# Patient Record
Sex: Female | Born: 2000 | Race: Black or African American | Hispanic: No | Marital: Single | Smoking: Never smoker
Health system: Southern US, Community
[De-identification: ages and names within clinical notes are randomized; demographics above are authoritative.]

## PROBLEM LIST (undated history)

## (undated) DIAGNOSIS — R109 Unspecified abdominal pain: Secondary | ICD-10-CM

## (undated) HISTORY — DX: Unspecified abdominal pain: R10.9

---
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
## 2020-02-15 ENCOUNTER — Encounter (HOSPITAL_COMMUNITY): Payer: Self-pay

## 2020-02-15 ENCOUNTER — Other Ambulatory Visit: Payer: Self-pay

## 2020-02-15 ENCOUNTER — Ambulatory Visit (HOSPITAL_COMMUNITY)
Admission: EM | Admit: 2020-02-15 | Discharge: 2020-02-15 | Disposition: A | Discharge status: Home / Self Care | Payer: Medicaid Other | Attending: Family Medicine | Admitting: Family Medicine

## 2020-02-15 DIAGNOSIS — R21 Rash and other nonspecific skin eruption: Secondary | ICD-10-CM | POA: Diagnosis not present

## 2020-02-15 DIAGNOSIS — R350 Frequency of micturition: Secondary | ICD-10-CM | POA: Diagnosis not present

## 2020-02-15 LAB — POC URINE PREG, ED: Preg Test, Ur: NEGATIVE

## 2020-02-15 LAB — POCT URINALYSIS DIPSTICK, ED / UC
Bilirubin Urine: NEGATIVE
Glucose, UA: NEGATIVE mg/dL
Ketones, ur: NEGATIVE mg/dL
Leukocytes,Ua: NEGATIVE
Nitrite: NEGATIVE
Protein, ur: NEGATIVE mg/dL
Specific Gravity, Urine: 1.02 (ref 1.005–1.030)
Urobilinogen, UA: 0.2 mg/dL (ref 0.0–1.0)
pH: 7.5 (ref 5.0–8.0)

## 2020-02-15 MED ORDER — TRIAMCINOLONE ACETONIDE 0.1 % EX CREA: 1.0000 "application " | TOPICAL_CREAM | Freq: Two times a day (BID) | CUTANEOUS | 0 refills | Status: DC

## 2020-02-15 NOTE — Discharge Instructions (Addendum)
Apply the cream twice a day to the area  Urine did not show any infection Decrease use of bladder irritants including coffee, teas and sodas. Follow up as needed for continued or worsening symptoms

## 2020-02-15 NOTE — ED Triage Notes (Signed)
Pt in with c/o rash to left side of neck that she noticed 2 days ago. States that it is itching and has turned from red to dark color.  Pt took ibuprofen for pain with no relief  Also states that she has been having urinary urgency Denies hematuria, abdominal pain, vaginal irritation,

## 2020-02-15 NOTE — ED Provider Notes (Signed)
[facility]    CSN: [id number] Arrival date & time: 02/15/20  1400      History   Chief Complaint Chief Complaint  Patient presents with  . Rash    HPI Jenna Tanner is a 19 y.o. female.   Patient is a 19 year old female presents today with multiple complaints.  First being rash to left side of neck that she noticed 2 days ago.  States mild itching and tenderness.  Went  from red to dark color.  Noted that her seatbelt rubs that area.  No new jewelry recently.  Denies any fever, joint pain. Denies any recent changes in lotions, detergents, foods or other possible irritants. No recent travel. Nobody else at home has the rash. Patient has been outside but denies any contact with plants or insects. No new foods or medications.  She has also been having urinary frequency. No hematuria, dysuria, abdominal or pelvic pain. No vaginal discharge. Patient's last menstrual period was 02/12/2020 (approximate).        Past Medical History:  Diagnosis Date  . Abdominal pain     Patient Active Problem List   Diagnosis Date Noted  . Nausea 10/18/2011  . Diarrhea 10/18/2011  . Generalized abdominal cramping     History reviewed. No pertinent surgical history.  OB History   No obstetric history on file.      Home Medications    Prior to Admission medications   Medication Sig Start Date End Date Taking? Authorizing Provider  cetirizine (ZYRTEC) 10 MG tablet Take 10 mg by mouth daily.    [provider]  ibuprofen (ADVIL,MOTRIN) 400 MG tablet Take 400 mg by mouth every 6 (six) hours.    [provider]  medroxyPROGESTERone (PROVERA) 10 MG tablet Take 10 mg by mouth daily.    [provider]  triamcinolone (KENALOG) 0.1 % Apply 1 application topically 2 (two) times daily. 02/15/20   Janace Aris, NP    Family History Family History  Problem Relation Age of Onset  . Lactose intolerance Maternal Grandmother   . Lactose intolerance Cousin    . Inflammatory bowel disease Neg Hx     Social History Social History   Tobacco Use  . Smoking status: Never Smoker  . Smokeless tobacco: Never Used  Vaping Use  . Vaping Use: Never used  Substance Use Topics  . Alcohol use: Never  . Drug use: Yes    Types: Marijuana     Allergies   Patient has no known allergies.   Review of Systems Review of Systems   Physical Exam Triage Vital Signs ED Triage Vitals  Enc Vitals Group     BP 02/15/20 1522 113/81     Pulse Rate 02/15/20 1522 78     Resp 02/15/20 1522 17     Temp 02/15/20 1522 99 F (37.2 C)     Temp Source 02/15/20 1522 Oral     SpO2 02/15/20 1522 100 %     Weight --      Height --      Head Circumference --      Peak Flow --      Pain Score 02/15/20 1518 7     Pain Loc --      Pain Edu? --      Excl. in GC? --    No data found.  Updated Vital Signs BP 113/81 (BP Location: Left Arm)   Pulse 78   Temp 99 F (37.2 C) (Oral)  Resp 17   LMP 02/12/2020 (Approximate)   SpO2 100%   Visual Acuity Right Eye Distance:   Left Eye Distance:   Bilateral Distance:    Right Eye Near:   Left Eye Near:    Bilateral Near:     Physical Exam Vitals and nursing note reviewed.  Constitutional:      General: She is not in acute distress.    Appearance: Normal appearance. She is not ill-appearing, toxic-appearing or diaphoretic.  HENT:     Head: Normocephalic.     Nose: Nose normal.  Eyes:     Conjunctiva/sclera: Conjunctivae normal.  Neck:      Comments: Erythema with hyperpigmented dry skin.  Pulmonary:     Effort: Pulmonary effort is normal.  Musculoskeletal:        General: Normal range of motion.     Cervical back: Normal range of motion.  Skin:    General: Skin is warm and dry.     Findings: Rash present.  Neurological:     Mental Status: She is alert.  Psychiatric:        Mood and Affect: Mood normal.      UC Treatments / Results  Labs (all labs ordered are listed, but only abnormal  results are displayed) Labs Reviewed - No data to display  EKG   Radiology No results found.  Procedures Procedures (including critical care time)  Medications Ordered in UC Medications - No data to display  Initial Impression / Assessment and Plan / UC Course  I have reviewed the triage vital signs and the nursing notes.  Pertinent labs & imaging results that were available during my care of the patient were reviewed by me and considered in my medical decision making (see chart for details).     Rash- most likely inflammatory from contact irritant  Will treat with steroid cream and have her monitor.   Urinary frequency Urine with small hgb  No concern for UTI at this time Pregnancy test negative.   Follow up as needed for continued or worsening symptoms   Final Clinical Impressions(s) / UC Diagnoses   Final diagnoses:  Rash  Urinary frequency     Discharge Instructions     Apply the cream twice a day.     ED Prescriptions    Medication Sig Dispense Auth. Provider   triamcinolone (KENALOG) 0.1 % Apply 1 application topically 2 (two) times daily. 30 g Dahlia Byes A, NP     PDMP not reviewed this encounter.   Janace Aris, NP 02/15/20 1621

## 2020-10-10 ENCOUNTER — Emergency Department (HOSPITAL_COMMUNITY): Payer: Medicaid Other

## 2020-10-10 ENCOUNTER — Other Ambulatory Visit: Payer: Self-pay

## 2020-10-10 ENCOUNTER — Emergency Department (HOSPITAL_COMMUNITY)
Admission: EM | Admit: 2020-10-10 | Discharge: 2020-10-10 | Disposition: A | Discharge status: Home / Self Care | Payer: Medicaid Other | Attending: Student | Admitting: Student

## 2020-10-10 DIAGNOSIS — R1084 Generalized abdominal pain: Secondary | ICD-10-CM | POA: Insufficient documentation

## 2020-10-10 DIAGNOSIS — R197 Diarrhea, unspecified: Secondary | ICD-10-CM | POA: Insufficient documentation

## 2020-10-10 DIAGNOSIS — N898 Other specified noninflammatory disorders of vagina: Secondary | ICD-10-CM | POA: Diagnosis not present

## 2020-10-10 DIAGNOSIS — W01198A Fall on same level from slipping, tripping and stumbling with subsequent striking against other object, initial encounter: Secondary | ICD-10-CM | POA: Insufficient documentation

## 2020-10-10 DIAGNOSIS — Z5321 Procedure and treatment not carried out due to patient leaving prior to being seen by health care provider: Secondary | ICD-10-CM | POA: Insufficient documentation

## 2020-10-10 DIAGNOSIS — M79661 Pain in right lower leg: Secondary | ICD-10-CM | POA: Insufficient documentation

## 2020-10-10 DIAGNOSIS — R112 Nausea with vomiting, unspecified: Secondary | ICD-10-CM | POA: Insufficient documentation

## 2020-10-10 LAB — URINALYSIS, ROUTINE W REFLEX MICROSCOPIC
Bilirubin Urine: NEGATIVE
Glucose, UA: NEGATIVE mg/dL
Hgb urine dipstick: NEGATIVE
Ketones, ur: 20 mg/dL — AB
Leukocytes,Ua: NEGATIVE
Nitrite: NEGATIVE
Protein, ur: NEGATIVE mg/dL
Specific Gravity, Urine: 1.02 (ref 1.005–1.030)
pH: 7 (ref 5.0–8.0)

## 2020-10-10 LAB — COMPREHENSIVE METABOLIC PANEL
ALT: 17 U/L (ref 0–44)
AST: 53 U/L — ABNORMAL HIGH (ref 15–41)
Albumin: 3.7 g/dL (ref 3.5–5.0)
Alkaline Phosphatase: 51 U/L (ref 38–126)
Anion gap: 6 (ref 5–15)
BUN: 8 mg/dL (ref 6–20)
CO2: 23 mmol/L (ref 22–32)
Calcium: 8.9 mg/dL (ref 8.9–10.3)
Chloride: 108 mmol/L (ref 98–111)
Creatinine, Ser: 1.01 mg/dL — ABNORMAL HIGH (ref 0.44–1.00)
GFR, Estimated: >60 mL/min (ref >60)
Glucose, Bld: 103 mg/dL — ABNORMAL HIGH (ref 70–99)
Potassium: 3.8 mmol/L (ref 3.5–5.1)
Sodium: 137 mmol/L (ref 135–145)
Total Bilirubin: 0.6 mg/dL (ref 0.3–1.2)
Total Protein: 6.7 g/dL (ref 6.5–8.1)

## 2020-10-10 LAB — CBC WITH DIFFERENTIAL/PLATELET
Abs Immature Granulocytes: 0.02 10*3/uL (ref 0.00–0.07)
Basophils Absolute: 0 10*3/uL (ref 0.0–0.1)
Basophils Relative: 0 %
Eosinophils Absolute: 0 10*3/uL (ref 0.0–0.5)
Eosinophils Relative: 1 %
HCT: 32.3 % — ABNORMAL LOW (ref 36.0–46.0)
Hemoglobin: 9.4 g/dL — ABNORMAL LOW (ref 12.0–15.0)
Immature Granulocytes: 0 %
Lymphocytes Relative: 34 %
Lymphs Abs: 2.4 10*3/uL (ref 0.7–4.0)
MCH: 22.2 pg — ABNORMAL LOW (ref 26.0–34.0)
MCHC: 29.1 g/dL — ABNORMAL LOW (ref 30.0–36.0)
MCV: 76.2 fL — ABNORMAL LOW (ref 80.0–100.0)
Monocytes Absolute: 0.5 10*3/uL (ref 0.1–1.0)
Monocytes Relative: 8 %
Neutro Abs: 4 10*3/uL (ref 1.7–7.7)
Neutrophils Relative %: 57 %
Platelets: 331 10*3/uL (ref 150–400)
RBC: 4.24 MIL/uL (ref 3.87–5.11)
RDW: 18.6 % — ABNORMAL HIGH (ref 11.5–15.5)
WBC: 6.9 10*3/uL (ref 4.0–10.5)
nRBC: 0 % (ref 0.0–0.2)

## 2020-10-10 LAB — I-STAT BETA HCG BLOOD, ED (MC, WL, AP ONLY): I-stat hCG, quantitative: <5 m[IU]/mL (ref <5)

## 2020-10-10 LAB — LIPASE, BLOOD: Lipase: 33 U/L (ref 11–51)

## 2020-10-10 NOTE — ED Provider Notes (Signed)
Emergency Medicine Provider Triage Evaluation Note  Jenna Tanner , a 20 y.o. female  was evaluated in triage.  Pt complains of RLE pain after a mechanical fall yesterday. Patient states she tripped and slammed her leg into a door. Pain associated with ecchymosis. No head injury or LOC  She also endorses generalized abdominal pain x3 days associated with nausea, vomiting, and diarrhea. She admits to vaginal bleeding today. Endorses increased vaginal discharge.   Review of Systems  Positive: Arthralgia, abdominal pain, N/V/D Negative: fever  Physical Exam  BP 121/66 (BP Location: Left Arm)   Pulse 78   Temp 98.8 F (37.1 C) (Oral)   Resp 16   Ht 5' (1.524 m)   Wt 59 kg   SpO2 100%   BMI 25.39 kg/m  Gen:   Awake, no distress   Resp:  Normal effort  MSK:   Moves extremities without difficulty  Other:  Abdomen soft, nondistended, nontender to palpation in all quadrants without guarding or peritoneal signs. No rebound.    Medical Decision Making  Medically screening exam initiated at 4:07 PM.  Appropriate orders placed.  Jenna Tanner was informed that the remainder of the evaluation will be completed by another provider, this initial triage assessment does not replace that evaluation, and the importance of remaining in the ED until their evaluation is complete.  Abdominal labs X-rays to rule out bony fractures   Mannie Stabile, PA-C 10/10/20 1610    Arby Barrette, MD 10/20/20 [phone]

## 2020-10-10 NOTE — ED Notes (Signed)
Patient transported to X-ray 

## 2020-10-10 NOTE — ED Notes (Signed)
Pt stated she was leaving with her mom.

## 2020-10-10 NOTE — ED Triage Notes (Signed)
C/o right lower leg pain. Stated trip and fell yesterday and ankle hurts when walking .

## 2021-05-20 ENCOUNTER — Encounter (HOSPITAL_COMMUNITY): Payer: Self-pay

## 2021-05-20 ENCOUNTER — Ambulatory Visit (HOSPITAL_COMMUNITY)
Admission: EM | Admit: 2021-05-20 | Discharge: 2021-05-20 | Disposition: A | Discharge status: Home / Self Care | Payer: Medicaid Other | Attending: Family Medicine | Admitting: Family Medicine

## 2021-05-20 ENCOUNTER — Other Ambulatory Visit: Payer: Self-pay

## 2021-05-20 DIAGNOSIS — R1030 Lower abdominal pain, unspecified: Secondary | ICD-10-CM | POA: Diagnosis present

## 2021-05-20 LAB — CBC WITH DIFFERENTIAL/PLATELET
Abs Immature Granulocytes: 0.06 10*3/uL (ref 0.00–0.07)
Basophils Absolute: 0.1 10*3/uL (ref 0.0–0.1)
Basophils Relative: 0 %
Eosinophils Absolute: 0.2 10*3/uL (ref 0.0–0.5)
Eosinophils Relative: 2 %
HCT: 34.5 % — ABNORMAL LOW (ref 36.0–46.0)
Hemoglobin: 10.7 g/dL — ABNORMAL LOW (ref 12.0–15.0)
Immature Granulocytes: 1 %
Lymphocytes Relative: 13 %
Lymphs Abs: 1.6 10*3/uL (ref 0.7–4.0)
MCH: 24.6 pg — ABNORMAL LOW (ref 26.0–34.0)
MCHC: 31 g/dL (ref 30.0–36.0)
MCV: 79.3 fL — ABNORMAL LOW (ref 80.0–100.0)
Monocytes Absolute: 0.5 10*3/uL (ref 0.1–1.0)
Monocytes Relative: 4 %
Neutro Abs: 10.2 10*3/uL — ABNORMAL HIGH (ref 1.7–7.7)
Neutrophils Relative %: 80 %
Platelets: 321 10*3/uL (ref 150–400)
RBC: 4.35 MIL/uL (ref 3.87–5.11)
RDW: 23.7 % — ABNORMAL HIGH (ref 11.5–15.5)
WBC: 12.6 10*3/uL — ABNORMAL HIGH (ref 4.0–10.5)
nRBC: 0 % (ref 0.0–0.2)

## 2021-05-20 LAB — COMPREHENSIVE METABOLIC PANEL
ALT: 15 U/L (ref 0–44)
AST: 21 U/L (ref 15–41)
Albumin: 3.3 g/dL — ABNORMAL LOW (ref 3.5–5.0)
Alkaline Phosphatase: 38 U/L (ref 38–126)
Anion gap: 8 (ref 5–15)
BUN: 12 mg/dL (ref 6–20)
CO2: 24 mmol/L (ref 22–32)
Calcium: 9.2 mg/dL (ref 8.9–10.3)
Chloride: 104 mmol/L (ref 98–111)
Creatinine, Ser: 1.08 mg/dL — ABNORMAL HIGH (ref 0.44–1.00)
GFR, Estimated: >60 mL/min (ref >60)
Glucose, Bld: 87 mg/dL (ref 70–99)
Potassium: 4.1 mmol/L (ref 3.5–5.1)
Sodium: 136 mmol/L (ref 135–145)
Total Bilirubin: 0.2 mg/dL — ABNORMAL LOW (ref 0.3–1.2)
Total Protein: 6.1 g/dL — ABNORMAL LOW (ref 6.5–8.1)

## 2021-05-20 LAB — POCT URINALYSIS DIPSTICK, ED / UC
Bilirubin Urine: NEGATIVE
Glucose, UA: NEGATIVE mg/dL
Hgb urine dipstick: NEGATIVE
Ketones, ur: NEGATIVE mg/dL
Leukocytes,Ua: NEGATIVE
Nitrite: NEGATIVE
Protein, ur: NEGATIVE mg/dL
Specific Gravity, Urine: 1.01 (ref 1.005–1.030)
Urobilinogen, UA: 0.2 mg/dL (ref 0.0–1.0)
pH: 7 (ref 5.0–8.0)

## 2021-05-20 LAB — LIPASE, BLOOD: Lipase: 40 U/L (ref 11–51)

## 2021-05-20 LAB — POC URINE PREG, ED: Preg Test, Ur: NEGATIVE

## 2021-05-20 NOTE — ED Triage Notes (Signed)
Pt c/o generalized abd cramping/pain. Pt states that the pain started after she was sexually active. Pt states she had clear/whitish discharge last week. Denies nausea, vomiting or diarrhea. LBM normal 05/19/21.

## 2021-05-20 NOTE — Discharge Instructions (Addendum)
You have been seen today for abdominal pain. Your evaluation was not suggestive of any emergent condition requiring medical intervention at this time. However, some abdominal problems make take more time to appear. Therefore, it is very important for you to pay attention to any new symptoms or worsening of your current condition.  Please return here or to the Emergency Department immediately should you begin to feel worse in any way or have any of the following symptoms: increasing or different abdominal pain, vomiting, inability to drink fluids, fevers, and/or shaking chills.   You have had lab work sent today (blood tests and STD testing). We will call you with any significant abnormalities or if there is need to begin or change treatment or pursue further follow up.  You may also review your test results online through MyChart. If you do not have a MyChart account, instructions to sign up should be on your discharge paperwork.

## 2021-05-22 LAB — CERVICOVAGINAL ANCILLARY ONLY
Bacterial Vaginitis (gardnerella): POSITIVE — AB
Candida Glabrata: NEGATIVE
Candida Vaginitis: POSITIVE — AB
Chlamydia: NEGATIVE
Comment: NEGATIVE
Comment: NEGATIVE
Comment: NEGATIVE
Comment: NEGATIVE
Comment: NEGATIVE
Comment: NORMAL
Neisseria Gonorrhea: NEGATIVE
Trichomonas: NEGATIVE

## 2021-05-22 NOTE — ED Provider Notes (Signed)
[name]   [id number] 05/20/21 Arrival Time: [place] PLAN:  1. Lower abdominal pain    Benign abdominal exam. No indications for urgent abdominal/pelvic imaging at this time. Discussed. Agrees to ED evaluation should her symptoms worsen in any way. Unclear etiology. Only sev hours into symptoms.  CBC, CMP, lipase pending. Vaginal cytology pending. She is comfortable with home observation. UPT and U/A negative.    Discharge Instructions      You have been seen today for abdominal pain. Your evaluation was not suggestive of any emergent condition requiring medical intervention at this time. However, some abdominal problems make take more time to appear. Therefore, it is very important for you to pay attention to any new symptoms or worsening of your current condition.  Please return here or to the Emergency Department immediately should you begin to feel worse in any way or have any of the following symptoms: increasing or different abdominal pain, vomiting, inability to drink fluids, fevers, and/or shaking chills.   You have had lab work sent today (blood tests and STD testing). We will call you with any significant abnormalities or if there is need to begin or change treatment or pursue further follow up.  You may also review your test results online through [facility]. If you do not have a MyChart account, instructions to sign up should be on your discharge paperwork.     Follow-up Information     [facility].   Specialty: Emergency Medicine Why: If symptoms worsen in any way. Contact information: [address] [id number] [place] [phone]               Reviewed expectations re: course of current medical issues. Questions answered. Outlined signs and symptoms indicating need for more acute intervention. Patient verbalized understanding. After Visit Summary  given.   SUBJECTIVE: History from: patient. Jenna Tanner is a 21 y.o. female who presents with complaint of intermittent lower abdominal discomfort. Onset abrupt,  sev hours ago . Discomfort described as dull; without radiation; does not wake her at night. Reports normal flatus. Symptoms are unchanged since beginning. Fever: absent. Aggravating factors: have not been identified. Alleviating factors: have not been identified. Associated symptoms: slight vaginal d/c noted last week, "but looks like my normal discharge". She denies fever, myalgias, and sweats. Appetite: normal. PO intake: normal. Ambulatory without assistance. Urinary symptoms: none. Bowel movements: have not significantly changed; last bowel movement yest and without blood. History of similar: no. OTC treatment: none.  No LMP recorded (lmp unknown).  History reviewed. No pertinent surgical history.   OBJECTIVE:  Vitals:   05/20/21 1330  BP: 129/82  Pulse: 72  Resp: 16  Temp: 98.3 F (36.8 C)  TempSrc: Oral  SpO2: 100%    General appearance: alert, oriented, no acute distress HEENT: [place]; AT; oropharynx moist Lungs: unlabored respirations Abdomen: soft; without distention; no specific tenderness to palpation; normal bowel sounds; without masses or organomegaly; without guarding or rebound tenderness GU: deferred Back: without reported CVA tenderness; FROM at waist Extremities: without LE edema; symmetrical; without gross deformities Skin: warm and dry Neurologic: normal gait Psychological: alert and cooperative; normal mood and affect    No Known Allergies  Past Medical History:  Diagnosis Date   Abdominal pain     Social History   Socioeconomic History   Marital status: Single    Spouse name: Not on file   Number of children: Not on file   Years of education: Not on file   Highest education level: Not on file  Occupational History   Not on file  Tobacco  Use   Smoking status: Never   Smokeless tobacco: Never  Vaping Use   Vaping Use: Never used  Substance and Sexual Activity   Alcohol use: Never   Drug use: Yes    Types: Marijuana   Sexual activity: Not on file  Other Topics Concern   Not on file  Social History Narrative   Starting 6th grade   Social Determinants of Health   Financial Resource Strain: Not on file  Food Insecurity: Not on file  Transportation Needs: Not on file  Physical Activity: Not on file  Stress: Not on file  Social Connections: Not on file  Intimate Partner Violence: Not on file    Family History  Problem Relation Age of Onset   Lactose intolerance Maternal Grandmother    Lactose intolerance Cousin    Inflammatory bowel disease Neg Hx      Vanessa Kick, MD 05/22/21 1023

## 2021-05-23 ENCOUNTER — Telehealth (HOSPITAL_COMMUNITY): Payer: Self-pay | Admitting: Emergency Medicine

## 2021-05-23 MED ORDER — FLUCONAZOLE 150 MG PO TABS: 150.0000 mg | ORAL_TABLET | Freq: Once | ORAL | 0 refills | Status: AC

## 2021-05-23 MED ORDER — METRONIDAZOLE 500 MG PO TABS: 500.0000 mg | ORAL_TABLET | Freq: Two times a day (BID) | ORAL | 0 refills | Status: DC

## 2022-01-19 IMAGING — DX DG TIBIA/FIBULA 2V*R*
2 series · 2 of 2 positions shown · non-contrast
Comparison: X-ray right ankle 04/24/2015.

CLINICAL DATA: trip and fell yesterday and ankle hurts when walking
.

EXAM:
RIGHT FOOT COMPLETE - 3+ VIEW; RIGHT TIBIA AND FIBULA - 2 VIEW;
RIGHT ANKLE - COMPLETE 3+ VIEW

[tibia ap]
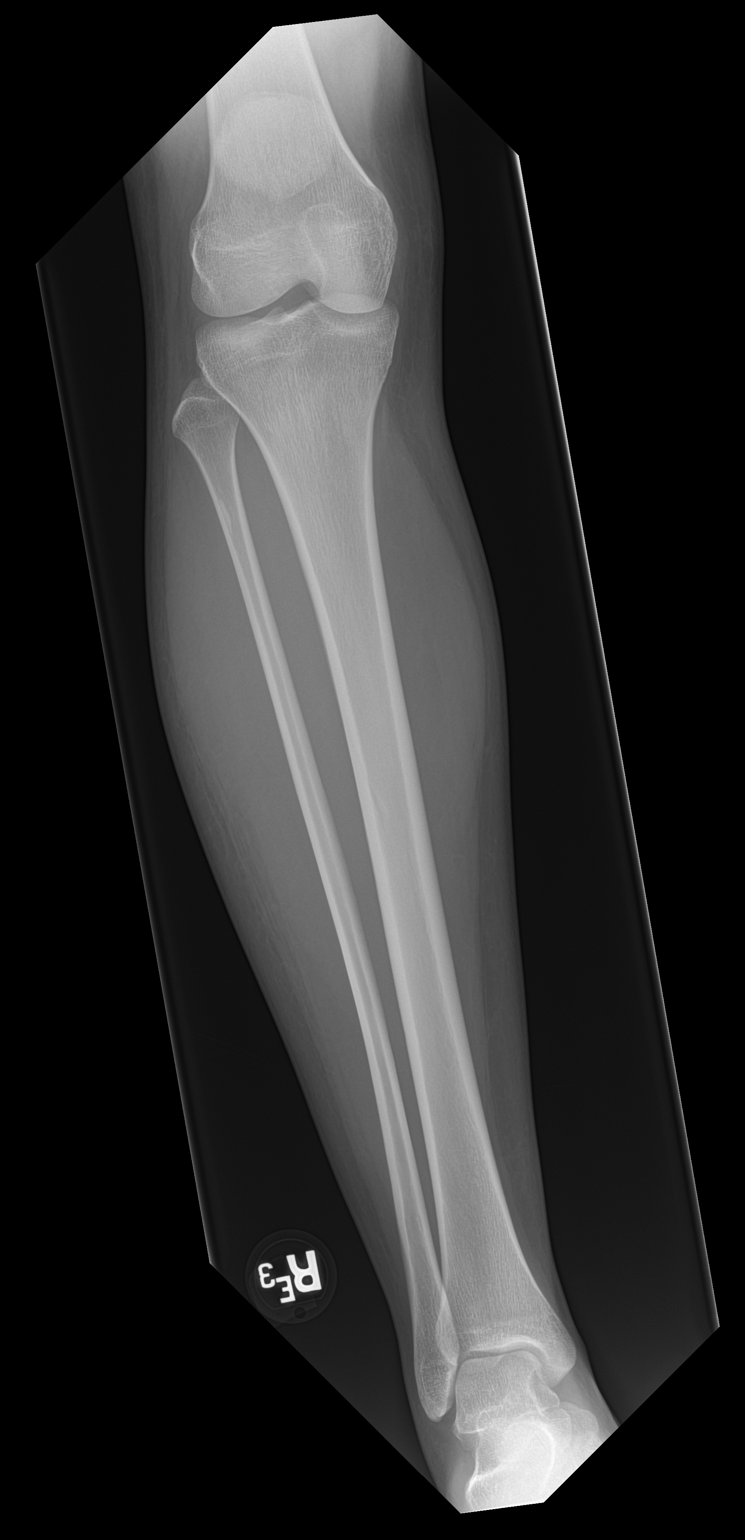

[tibia lat]
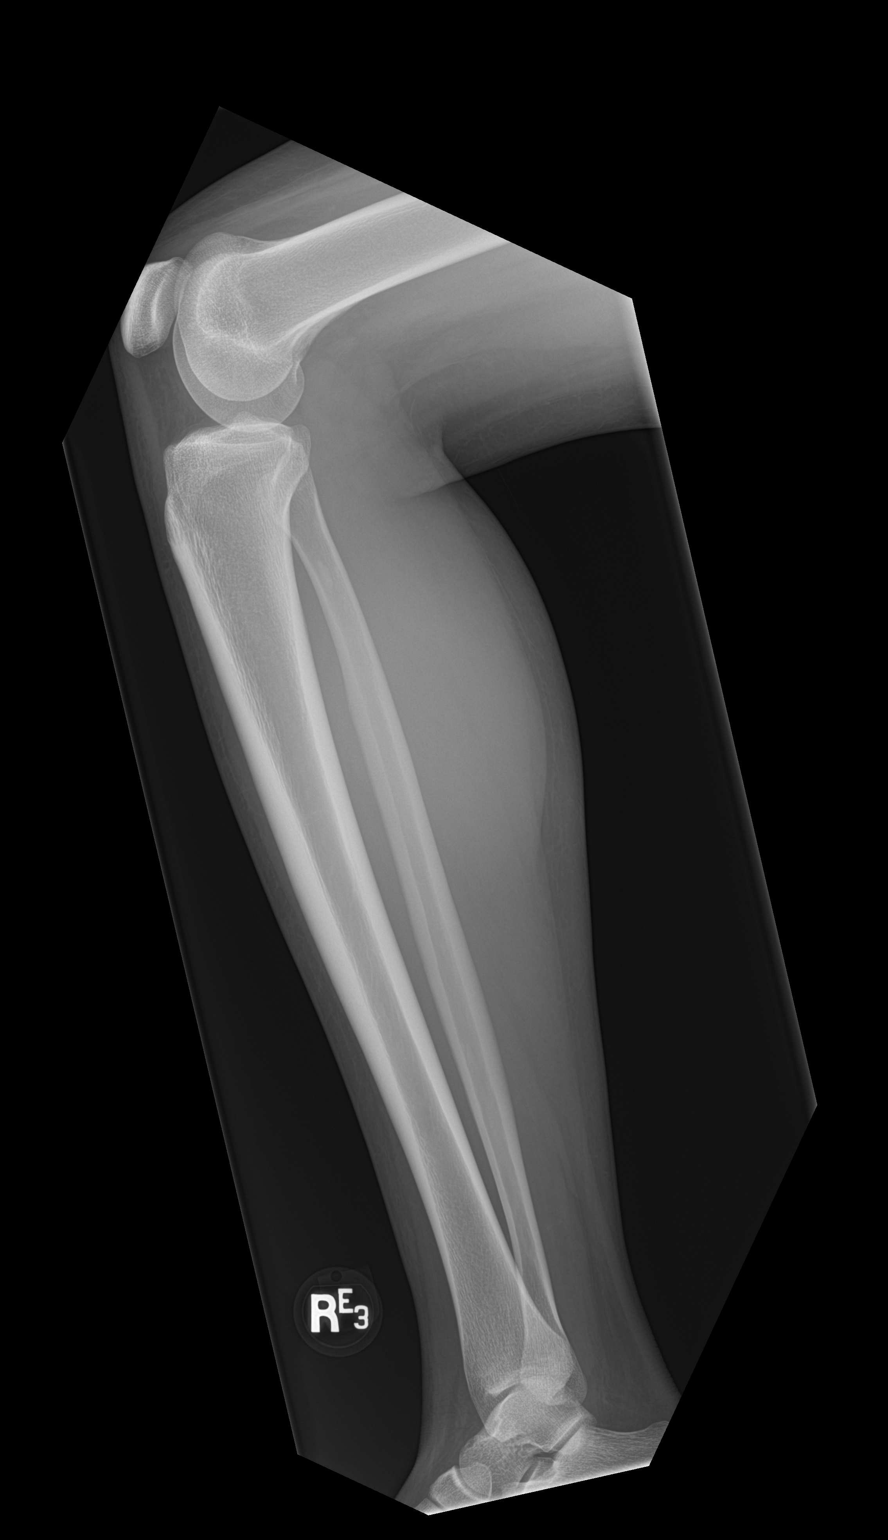

[2 of 2 positions shown; findings below may reference images not displayed]

FINDINGS: Right foot: No evidence of fracture, dislocation, or joint effusion.
No evidence of severe arthropathy. No aggressive appearing focal
bone abnormality. Soft tissues are unremarkable.

Right ankle: No evidence of fracture, dislocation, or joint
effusion. No evidence of severe arthropathy. No aggressive appearing
focal bone abnormality. Soft tissues are unremarkable.

Right tibia fibula: No evidence of fracture, dislocation, or joint
effusion. Visualized the grossly unremarkable. No evidence of severe
arthropathy. No aggressive appearing focal bone abnormality. Soft
tissues are unremarkable.
IMPRESSION: No acute displaced fracture or dislocation of the right foot, ankle,
tibia fibula.

## 2022-10-05 ENCOUNTER — Ambulatory Visit (HOSPITAL_COMMUNITY)
Admission: EM | Admit: 2022-10-05 | Discharge: 2022-10-05 | Disposition: A | Discharge status: Home / Self Care | Payer: Medicaid Other | Attending: Internal Medicine | Admitting: Internal Medicine

## 2022-10-05 ENCOUNTER — Encounter (HOSPITAL_COMMUNITY): Payer: Self-pay

## 2022-10-05 DIAGNOSIS — B349 Viral infection, unspecified: Secondary | ICD-10-CM

## 2022-10-05 LAB — POCT URINALYSIS DIP (MANUAL ENTRY)
Bilirubin, UA: NEGATIVE
Glucose, UA: NEGATIVE mg/dL
Ketones, POC UA: NEGATIVE mg/dL
Leukocytes, UA: NEGATIVE
Nitrite, UA: NEGATIVE
Spec Grav, UA: 1.02 (ref 1.010–1.025)
Urobilinogen, UA: 0.2 E.U./dL
pH, UA: 6 (ref 5.0–8.0)

## 2022-10-05 LAB — POCT URINE PREGNANCY: Preg Test, Ur: NEGATIVE

## 2022-10-05 LAB — POCT INFLUENZA A/B
Influenza A, POC: NEGATIVE
Influenza B, POC: NEGATIVE

## 2022-10-05 NOTE — ED Triage Notes (Signed)
Pt c/o body aches, headache, and back pain x3 days. States took ibuprofen with relief. States change in appetite and nausea at times.

## 2022-10-05 NOTE — Discharge Instructions (Addendum)
The flu test was negative You may have COVID or another similar virus especially given your recent travel by plane. No matter what virus you have it is contagious!  Continue symptomatic care; alternate tylenol and ibuprofen, increase fluids, try throat lozenges if needed.

## 2022-10-05 NOTE — ED Provider Notes (Signed)
[facility]    CSN: [id number] Arrival date & time: 10/05/22  1501     History   Chief Complaint Chief Complaint  Patient presents with   Generalized Body Aches    HPI Jenna Tanner is a 22 y.o. female.  3 day history of generalized body aches, mild headache. Rates discomfort 3/10 Some infrequent nausea. No vomiting or diarrhea. No urinary symptoms but wonders if she has UTI No fever or chills Denies sore throat, congestion, cough, abd pain, or rash  Has been taking ibuprofen that relives symptoms   Flew back from [place] one week ago, and lots of sick contacts at work    LMP reported as 7/1 but only lasted one day, may be due soon  Past Medical History:  Diagnosis Date   Abdominal pain     Patient Active Problem List   Diagnosis Date Noted   Nausea 10/18/2011   Diarrhea 10/18/2011   Generalized abdominal cramping     History reviewed. No pertinent surgical history.  OB History   No obstetric history on file.      Home Medications    Prior to Admission medications   Not on File    Family History Family History  Problem Relation Age of Onset   Healthy Mother    Lactose intolerance Maternal Grandmother    Lactose intolerance Cousin    Inflammatory bowel disease Neg Hx     Social History Social History   Tobacco Use   Smoking status: Never   Smokeless tobacco: Never  Vaping Use   Vaping status: Never Used  Substance Use Topics   Alcohol use: Never   Drug use: Yes    Types: Marijuana     Allergies   Patient has no known allergies.   Review of Systems Review of Systems As per HPI  Physical Exam Triage Vital Signs ED Triage Vitals  Encounter Vitals Group     BP 10/05/22 1534 129/85     Systolic BP Percentile --      Diastolic BP Percentile --      Pulse Rate 10/05/22 1534 96     Resp 10/05/22 1534 16     Temp 10/05/22 1534 98.3 F (36.8 C)     Temp Source 10/05/22 1534 Oral     SpO2 10/05/22 1534 98 %     Weight --       Height --      Head Circumference --      Peak Flow --      Pain Score 10/05/22 1535 3     Pain Loc --      Pain Education --      Exclude from Growth Chart --    No data found.  Updated Vital Signs BP 129/85 (BP Location: Left Arm)   Pulse 96   Temp 98.3 F (36.8 C) (Oral)   Resp 16   LMP 09/24/2022   SpO2 98%   Physical Exam Vitals and nursing note reviewed.  Constitutional:      General: She is not in acute distress.    Appearance: She is not ill-appearing.  HENT:     Nose: No congestion or rhinorrhea.     Mouth/Throat:     Mouth: Mucous membranes are moist.     Pharynx: Oropharynx is clear. No posterior oropharyngeal erythema.  Eyes:     Conjunctiva/sclera: Conjunctivae normal.  Cardiovascular:     Rate and Rhythm: Normal rate and regular rhythm.  Pulses: Normal pulses.     Heart sounds: Normal heart sounds.  Pulmonary:     Effort: Pulmonary effort is normal.     Breath sounds: Normal breath sounds.  Abdominal:     General: Abdomen is flat.     Palpations: Abdomen is soft.     Tenderness: There is no abdominal tenderness. There is no right CVA tenderness, left CVA tenderness, guarding or rebound.  Musculoskeletal:     Cervical back: Normal range of motion.  Lymphadenopathy:     Cervical: No cervical adenopathy.  Skin:    General: Skin is warm and dry.  Neurological:     Mental Status: She is alert and oriented to person, place, and time.    UC Treatments / Results  Labs (all labs ordered are listed, but only abnormal results are displayed) Labs Reviewed  POCT URINALYSIS DIP (MANUAL ENTRY) - Abnormal; Notable for the following components:      Result Value   Blood, UA small (*)    Protein Ur, POC trace (*)    All other components within normal limits  POCT URINE PREGNANCY  POCT INFLUENZA A/B    EKG  Radiology No results found.  Procedures Procedures  Medications Ordered in UC Medications - No data to display  Initial Impression /  Assessment and Plan / UC Course  I have reviewed the triage vital signs and the nursing notes.  Pertinent labs & imaging results that were available during my care of the patient were reviewed by me and considered in my medical decision making (see chart for details).  UPT negative  UA small RBC, reports may be starting cycle Defer culture, low concern for UTI  Rapid flu A/B per patient request is negative Declines covid test Suspect viral etiology. Continue symptomatic care at home. Increase fluids. Provided with work note. Return precautions discussed, patient agreeable to plan  Final Clinical Impressions(s) / UC Diagnoses   Final diagnoses:  Viral illness     Discharge Instructions      The flu test was negative You may have COVID or another similar virus especially given your recent travel by plane. No matter what virus you have it is contagious!  Continue symptomatic care; alternate tylenol and ibuprofen, increase fluids, try throat lozenges if needed.     ED Prescriptions   None    PDMP not reviewed this encounter.   Angelene Rome, [place] 10/05/22 1625
